# Patient Record
Sex: Male | Born: 1985 | Race: Black or African American | Hispanic: No | Marital: Single | Smoking: Never smoker
Health system: Southern US, Community
[De-identification: ages and names within clinical notes are randomized; demographics above are authoritative.]

## PROBLEM LIST (undated history)

## (undated) DIAGNOSIS — J45909 Unspecified asthma, uncomplicated: Secondary | ICD-10-CM

## (undated) DIAGNOSIS — T7840XA Allergy, unspecified, initial encounter: Secondary | ICD-10-CM

## (undated) DIAGNOSIS — K219 Gastro-esophageal reflux disease without esophagitis: Secondary | ICD-10-CM

## (undated) HISTORY — DX: Unspecified asthma, uncomplicated: J45.909

## (undated) HISTORY — DX: Allergy, unspecified, initial encounter: T78.40XA

---
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
## 2004-12-13 ENCOUNTER — Ambulatory Visit: Payer: Self-pay | Admitting: Family Medicine

## 2005-01-03 ENCOUNTER — Ambulatory Visit: Payer: Self-pay | Admitting: *Deleted

## 2005-01-25 ENCOUNTER — Ambulatory Visit: Payer: Self-pay | Admitting: Family Medicine

## 2005-06-01 ENCOUNTER — Ambulatory Visit: Payer: Self-pay | Admitting: Internal Medicine

## 2006-07-17 ENCOUNTER — Ambulatory Visit: Payer: Self-pay | Admitting: Family Medicine

## 2006-10-31 ENCOUNTER — Encounter (INDEPENDENT_AMBULATORY_CARE_PROVIDER_SITE_OTHER): Payer: Self-pay | Admitting: *Deleted

## 2007-02-22 ENCOUNTER — Ambulatory Visit: Payer: Self-pay | Admitting: Family Medicine

## 2007-02-22 LAB — CONVERTED CEMR LAB
BUN: 16 mg/dL (ref 6–23)
Chlamydia, Swab/Urine, PCR: NEGATIVE
Chloride: 100 meq/L (ref 96–112)
Glucose, Bld: 146 mg/dL — ABNORMAL HIGH (ref 70–99)
Potassium: 4.3 meq/L (ref 3.5–5.3)

## 2007-06-14 ENCOUNTER — Ambulatory Visit: Payer: Self-pay | Admitting: Family Medicine

## 2019-04-23 ENCOUNTER — Ambulatory Visit: Payer: Self-pay | Attending: Internal Medicine

## 2019-04-23 DIAGNOSIS — Z20822 Contact with and (suspected) exposure to covid-19: Secondary | ICD-10-CM

## 2019-04-24 LAB — NOVEL CORONAVIRUS, NAA: SARS-CoV-2, NAA: NOT DETECTED

## 2020-02-24 ENCOUNTER — Other Ambulatory Visit: Payer: Self-pay

## 2020-02-24 DIAGNOSIS — Z20822 Contact with and (suspected) exposure to covid-19: Secondary | ICD-10-CM

## 2020-02-29 LAB — NOVEL CORONAVIRUS, NAA: SARS-CoV-2, NAA: NOT DETECTED

## 2021-03-13 ENCOUNTER — Other Ambulatory Visit: Payer: Self-pay

## 2021-03-13 ENCOUNTER — Encounter (HOSPITAL_COMMUNITY): Payer: Self-pay | Admitting: *Deleted

## 2021-03-13 ENCOUNTER — Observation Stay (HOSPITAL_COMMUNITY): Payer: No Typology Code available for payment source | Admitting: Certified Registered"

## 2021-03-13 ENCOUNTER — Observation Stay (HOSPITAL_COMMUNITY): Payer: No Typology Code available for payment source

## 2021-03-13 ENCOUNTER — Inpatient Hospital Stay (HOSPITAL_COMMUNITY): Payer: No Typology Code available for payment source

## 2021-03-13 ENCOUNTER — Encounter (HOSPITAL_COMMUNITY)
Admission: EM | Disposition: A | Discharge status: Home / Self Care | Payer: Self-pay | Source: Home / Self Care | Attending: Orthopedic Surgery

## 2021-03-13 ENCOUNTER — Inpatient Hospital Stay (HOSPITAL_COMMUNITY)
Admission: EM | Admit: 2021-03-13 | Discharge: 2021-03-14 | DRG: 493 | Disposition: A | Discharge status: Home / Self Care | Payer: No Typology Code available for payment source | Attending: Orthopedic Surgery | Admitting: Orthopedic Surgery

## 2021-03-13 ENCOUNTER — Emergency Department (HOSPITAL_COMMUNITY): Payer: No Typology Code available for payment source

## 2021-03-13 DIAGNOSIS — Z23 Encounter for immunization: Secondary | ICD-10-CM

## 2021-03-13 DIAGNOSIS — D62 Acute posthemorrhagic anemia: Secondary | ICD-10-CM | POA: Diagnosis present

## 2021-03-13 DIAGNOSIS — Z888 Allergy status to other drugs, medicaments and biological substances status: Secondary | ICD-10-CM | POA: Diagnosis not present

## 2021-03-13 DIAGNOSIS — E559 Vitamin D deficiency, unspecified: Secondary | ICD-10-CM | POA: Diagnosis present

## 2021-03-13 DIAGNOSIS — Z20822 Contact with and (suspected) exposure to covid-19: Secondary | ICD-10-CM | POA: Diagnosis present

## 2021-03-13 DIAGNOSIS — S82235B Nondisplaced oblique fracture of shaft of left tibia, initial encounter for open fracture type I or II: Secondary | ICD-10-CM

## 2021-03-13 DIAGNOSIS — S85302A Unspecified injury of greater saphenous vein at lower leg level, left leg, initial encounter: Secondary | ICD-10-CM | POA: Diagnosis present

## 2021-03-13 DIAGNOSIS — T148XXA Other injury of unspecified body region, initial encounter: Secondary | ICD-10-CM

## 2021-03-13 DIAGNOSIS — K219 Gastro-esophageal reflux disease without esophagitis: Secondary | ICD-10-CM | POA: Diagnosis present

## 2021-03-13 DIAGNOSIS — S82202B Unspecified fracture of shaft of left tibia, initial encounter for open fracture type I or II: Secondary | ICD-10-CM | POA: Diagnosis present

## 2021-03-13 DIAGNOSIS — W3400XA Accidental discharge from unspecified firearms or gun, initial encounter: Secondary | ICD-10-CM | POA: Diagnosis not present

## 2021-03-13 DIAGNOSIS — T1490XA Injury, unspecified, initial encounter: Secondary | ICD-10-CM

## 2021-03-13 DIAGNOSIS — S82402B Unspecified fracture of shaft of left fibula, initial encounter for open fracture type I or II: Secondary | ICD-10-CM | POA: Diagnosis present

## 2021-03-13 DIAGNOSIS — S71131A Puncture wound without foreign body, right thigh, initial encounter: Secondary | ICD-10-CM | POA: Diagnosis present

## 2021-03-13 DIAGNOSIS — S82235A Nondisplaced oblique fracture of shaft of left tibia, initial encounter for closed fracture: Secondary | ICD-10-CM | POA: Diagnosis present

## 2021-03-13 DIAGNOSIS — Y249XXA Unspecified firearm discharge, undetermined intent, initial encounter: Secondary | ICD-10-CM

## 2021-03-13 HISTORY — PX: TIBIA IM NAIL INSERTION: SHX2516

## 2021-03-13 HISTORY — PX: I & D EXTREMITY: SHX5045

## 2021-03-13 HISTORY — DX: Gastro-esophageal reflux disease without esophagitis: K21.9

## 2021-03-13 HISTORY — PX: WOUND DEBRIDEMENT: SHX247

## 2021-03-13 LAB — CBC
HCT: 44.7 % (ref 39.0–52.0)
Hemoglobin: 14.6 g/dL (ref 13.0–17.0)
MCH: 29.6 pg (ref 26.0–34.0)
MCHC: 32.7 g/dL (ref 30.0–36.0)
MCV: 90.5 fL (ref 80.0–100.0)
Platelets: 160 10*3/uL (ref 150–400)
RBC: 4.94 MIL/uL (ref 4.22–5.81)
RDW: 13.3 % (ref 11.5–15.5)
WBC: 11.9 10*3/uL — ABNORMAL HIGH (ref 4.0–10.5)
nRBC: 0 % (ref 0.0–0.2)

## 2021-03-13 LAB — COMPREHENSIVE METABOLIC PANEL
ALT: 24 U/L (ref 0–44)
AST: 25 U/L (ref 15–41)
Albumin: 4.3 g/dL (ref 3.5–5.0)
Alkaline Phosphatase: 47 U/L (ref 38–126)
Anion gap: 13 (ref 5–15)
BUN: 11 mg/dL (ref 6–20)
CO2: 21 mmol/L — ABNORMAL LOW (ref 22–32)
Calcium: 8.8 mg/dL — ABNORMAL LOW (ref 8.9–10.3)
Chloride: 104 mmol/L (ref 98–111)
Creatinine, Ser: 1.3 mg/dL — ABNORMAL HIGH (ref 0.61–1.24)
GFR, Estimated: >60 mL/min (ref >60)
Glucose, Bld: 140 mg/dL — ABNORMAL HIGH (ref 70–99)
Potassium: 3.4 mmol/L — ABNORMAL LOW (ref 3.5–5.1)
Sodium: 138 mmol/L (ref 135–145)
Total Bilirubin: 0.5 mg/dL (ref 0.3–1.2)
Total Protein: 7.1 g/dL (ref 6.5–8.1)

## 2021-03-13 LAB — I-STAT CHEM 8, ED
BUN: 13 mg/dL (ref 6–20)
Calcium, Ion: 1.1 mmol/L — ABNORMAL LOW (ref 1.15–1.40)
Chloride: 104 mmol/L (ref 98–111)
Creatinine, Ser: 1.3 mg/dL — ABNORMAL HIGH (ref 0.61–1.24)
Glucose, Bld: 136 mg/dL — ABNORMAL HIGH (ref 70–99)
HCT: 46 % (ref 39.0–52.0)
Hemoglobin: 15.6 g/dL (ref 13.0–17.0)
Potassium: 3.4 mmol/L — ABNORMAL LOW (ref 3.5–5.1)
Sodium: 142 mmol/L (ref 135–145)
TCO2: 24 mmol/L (ref 22–32)

## 2021-03-13 LAB — URINALYSIS, ROUTINE W REFLEX MICROSCOPIC
Bacteria, UA: NONE SEEN
Bilirubin Urine: NEGATIVE
Glucose, UA: NEGATIVE mg/dL
Ketones, ur: NEGATIVE mg/dL
Leukocytes,Ua: NEGATIVE
Nitrite: NEGATIVE
Protein, ur: NEGATIVE mg/dL
Specific Gravity, Urine: 1.044 — ABNORMAL HIGH (ref 1.005–1.030)
pH: 5 (ref 5.0–8.0)

## 2021-03-13 LAB — PROTIME-INR
INR: 0.9 (ref 0.8–1.2)
Prothrombin Time: 12.1 seconds (ref 11.4–15.2)

## 2021-03-13 LAB — ETHANOL: Alcohol, Ethyl (B): 102 mg/dL — ABNORMAL HIGH (ref <10)

## 2021-03-13 LAB — SAMPLE TO BLOOD BANK

## 2021-03-13 LAB — RESP PANEL BY RT-PCR (FLU A&B, COVID) ARPGX2
Influenza A by PCR: NEGATIVE
Influenza B by PCR: NEGATIVE
SARS Coronavirus 2 by RT PCR: NEGATIVE

## 2021-03-13 LAB — LACTIC ACID, PLASMA: Lactic Acid, Venous: 4.1 mmol/L (ref 0.5–1.9)

## 2021-03-13 SURGERY — INSERTION, INTRAMEDULLARY ROD, TIBIA
Anesthesia: General | Site: Thigh | Laterality: Right

## 2021-03-13 MED ORDER — METOCLOPRAMIDE HCL 5 MG/ML IJ SOLN: 5.0000 mg | Freq: Three times a day (TID) | INTRAMUSCULAR | Status: DC | PRN

## 2021-03-13 MED ORDER — CEFAZOLIN SODIUM-DEXTROSE 2-4 GM/100ML-% IV SOLN
2.0000 g | Freq: Three times a day (TID) | INTRAVENOUS | Status: AC
  Administered 2021-03-13 – 2021-03-14 (×3): 2 g via INTRAVENOUS
  Filled 2021-03-13 (×3): qty 100

## 2021-03-13 MED ORDER — CEFAZOLIN SODIUM-DEXTROSE 2-4 GM/100ML-% IV SOLN
2.0000 g | Freq: Once | INTRAVENOUS | Status: AC
  Administered 2021-03-13: 2 g via INTRAVENOUS
  Filled 2021-03-13: qty 100

## 2021-03-13 MED ORDER — DEXAMETHASONE SODIUM PHOSPHATE 10 MG/ML IJ SOLN
INTRAMUSCULAR | Status: DC | PRN
Start: 2021-03-13 — End: 2021-03-13
  Administered 2021-03-13: 10 mg via INTRAVENOUS

## 2021-03-13 MED ORDER — HYDROMORPHONE HCL 1 MG/ML IJ SOLN
0.5000 mg | INTRAMUSCULAR | Status: DC | PRN
  Administered 2021-03-13: 0.5 mg via INTRAVENOUS
  Filled 2021-03-13: qty 1

## 2021-03-13 MED ORDER — CEFAZOLIN SODIUM-DEXTROSE 2-3 GM-%(50ML) IV SOLR
INTRAVENOUS | Status: DC | PRN
Start: 2021-03-13 — End: 2021-03-13
  Administered 2021-03-13: 1.9 g via INTRAVENOUS
  Administered 2021-03-13: .1 g via INTRAVENOUS

## 2021-03-13 MED ORDER — DOCUSATE SODIUM 100 MG PO CAPS
100.0000 mg | ORAL_CAPSULE | Freq: Two times a day (BID) | ORAL | Status: DC
  Administered 2021-03-13 – 2021-03-14 (×2): 100 mg via ORAL
  Filled 2021-03-13 (×2): qty 1

## 2021-03-13 MED ORDER — HYDROMORPHONE HCL 1 MG/ML IJ SOLN: 0.5000 mg | INTRAMUSCULAR | Status: DC | PRN

## 2021-03-13 MED ORDER — CHLORHEXIDINE GLUCONATE 0.12 % MT SOLN: 15.0000 mL | Freq: Once | OROMUCOSAL | Status: AC

## 2021-03-13 MED ORDER — LACTATED RINGERS IV SOLN: INTRAVENOUS | Status: DC

## 2021-03-13 MED ORDER — PROPOFOL 10 MG/ML IV BOLUS
INTRAVENOUS | Status: AC
  Filled 2021-03-13: qty 20

## 2021-03-13 MED ORDER — POTASSIUM CHLORIDE IN NACL 20-0.9 MEQ/L-% IV SOLN
INTRAVENOUS | Status: DC
  Filled 2021-03-13 (×2): qty 1000

## 2021-03-13 MED ORDER — TETANUS-DIPHTH-ACELL PERTUSSIS 5-2.5-18.5 LF-MCG/0.5 IM SUSY
0.5000 mL | PREFILLED_SYRINGE | Freq: Once | INTRAMUSCULAR | Status: AC
  Administered 2021-03-13: 0.5 mL via INTRAMUSCULAR
  Filled 2021-03-13: qty 0.5

## 2021-03-13 MED ORDER — BISACODYL 5 MG PO TBEC: 5.0000 mg | DELAYED_RELEASE_TABLET | Freq: Every day | ORAL | Status: DC | PRN

## 2021-03-13 MED ORDER — FENTANYL CITRATE (PF) 100 MCG/2ML IJ SOLN: 25.0000 ug | INTRAMUSCULAR | Status: DC | PRN

## 2021-03-13 MED ORDER — FENTANYL CITRATE (PF) 250 MCG/5ML IJ SOLN
INTRAMUSCULAR | Status: AC
  Filled 2021-03-13: qty 5

## 2021-03-13 MED ORDER — METHOCARBAMOL 750 MG PO TABS
750.0000 mg | ORAL_TABLET | Freq: Three times a day (TID) | ORAL | Status: DC
  Administered 2021-03-13 – 2021-03-14 (×4): 750 mg via ORAL
  Filled 2021-03-13 (×4): qty 1

## 2021-03-13 MED ORDER — MIDAZOLAM HCL 2 MG/2ML IJ SOLN
INTRAMUSCULAR | Status: AC
  Filled 2021-03-13: qty 2

## 2021-03-13 MED ORDER — 0.9 % SODIUM CHLORIDE (POUR BTL) OPTIME
TOPICAL | Status: DC | PRN
  Administered 2021-03-13 (×2): 1000 mL

## 2021-03-13 MED ORDER — OXYCODONE HCL 5 MG PO TABS: 5.0000 mg | ORAL_TABLET | ORAL | Status: DC | PRN

## 2021-03-13 MED ORDER — FERROUS SULFATE 325 (65 FE) MG PO TABS
325.0000 mg | ORAL_TABLET | Freq: Three times a day (TID) | ORAL | Status: DC
  Administered 2021-03-13 – 2021-03-14 (×3): 325 mg via ORAL
  Filled 2021-03-13 (×3): qty 1

## 2021-03-13 MED ORDER — KETOROLAC TROMETHAMINE 15 MG/ML IJ SOLN
15.0000 mg | Freq: Four times a day (QID) | INTRAMUSCULAR | Status: DC
  Administered 2021-03-13 – 2021-03-14 (×5): 15 mg via INTRAVENOUS
  Filled 2021-03-13 (×5): qty 1

## 2021-03-13 MED ORDER — CHLORHEXIDINE GLUCONATE 0.12 % MT SOLN
OROMUCOSAL | Status: AC
  Administered 2021-03-13: 15 mL via OROMUCOSAL
  Filled 2021-03-13: qty 15

## 2021-03-13 MED ORDER — METHOCARBAMOL 1000 MG/10ML IJ SOLN
500.0000 mg | Freq: Three times a day (TID) | INTRAVENOUS | Status: DC
  Filled 2021-03-13: qty 5

## 2021-03-13 MED ORDER — DIPHENHYDRAMINE HCL 50 MG/ML IJ SOLN
INTRAMUSCULAR | Status: DC | PRN
  Administered 2021-03-13: 25 mg via INTRAVENOUS

## 2021-03-13 MED ORDER — ENOXAPARIN SODIUM 40 MG/0.4ML IJ SOSY
40.0000 mg | PREFILLED_SYRINGE | INTRAMUSCULAR | Status: DC
  Administered 2021-03-14: 40 mg via SUBCUTANEOUS
  Filled 2021-03-13: qty 0.4

## 2021-03-13 MED ORDER — ONDANSETRON HCL 4 MG/2ML IJ SOLN
INTRAMUSCULAR | Status: DC | PRN
  Administered 2021-03-13: 4 mg via INTRAVENOUS

## 2021-03-13 MED ORDER — LIDOCAINE 2% (20 MG/ML) 5 ML SYRINGE
INTRAMUSCULAR | Status: DC | PRN
  Administered 2021-03-13: 60 mg via INTRAVENOUS

## 2021-03-13 MED ORDER — FENTANYL CITRATE PF 50 MCG/ML IJ SOSY
50.0000 ug | PREFILLED_SYRINGE | Freq: Once | INTRAMUSCULAR | Status: DC
  Filled 2021-03-13: qty 1

## 2021-03-13 MED ORDER — SODIUM CHLORIDE 0.9 % IV SOLN: INTRAVENOUS | Status: AC

## 2021-03-13 MED ORDER — SUGAMMADEX SODIUM 200 MG/2ML IV SOLN
INTRAVENOUS | Status: DC | PRN
  Administered 2021-03-13: 200 mg via INTRAVENOUS

## 2021-03-13 MED ORDER — OXYCODONE HCL 5 MG PO TABS
10.0000 mg | ORAL_TABLET | ORAL | Status: DC | PRN
  Administered 2021-03-13 – 2021-03-14 (×3): 15 mg via ORAL
  Filled 2021-03-13 (×5): qty 3

## 2021-03-13 MED ORDER — ONDANSETRON HCL 4 MG PO TABS: 4.0000 mg | ORAL_TABLET | Freq: Four times a day (QID) | ORAL | Status: DC | PRN

## 2021-03-13 MED ORDER — ROCURONIUM BROMIDE 10 MG/ML (PF) SYRINGE
PREFILLED_SYRINGE | INTRAVENOUS | Status: DC | PRN
  Administered 2021-03-13 (×2): 50 mg via INTRAVENOUS

## 2021-03-13 MED ORDER — SODIUM CHLORIDE 0.9 % IV BOLUS
1000.0000 mL | Freq: Once | INTRAVENOUS | Status: AC
  Administered 2021-03-13: 1000 mL via INTRAVENOUS

## 2021-03-13 MED ORDER — ACETAMINOPHEN 500 MG PO TABS
1000.0000 mg | ORAL_TABLET | Freq: Three times a day (TID) | ORAL | Status: AC
  Administered 2021-03-13 – 2021-03-14 (×4): 1000 mg via ORAL
  Filled 2021-03-13 (×4): qty 2

## 2021-03-13 MED ORDER — METOCLOPRAMIDE HCL 10 MG PO TABS: 5.0000 mg | ORAL_TABLET | Freq: Three times a day (TID) | ORAL | Status: DC | PRN

## 2021-03-13 MED ORDER — ONDANSETRON HCL 4 MG/2ML IJ SOLN: 4.0000 mg | Freq: Four times a day (QID) | INTRAMUSCULAR | Status: DC | PRN

## 2021-03-13 MED ORDER — FENTANYL CITRATE (PF) 250 MCG/5ML IJ SOLN
INTRAMUSCULAR | Status: DC | PRN
  Administered 2021-03-13 (×3): 50 ug via INTRAVENOUS
  Administered 2021-03-13: 100 ug via INTRAVENOUS

## 2021-03-13 MED ORDER — PHENYLEPHRINE HCL-NACL 20-0.9 MG/250ML-% IV SOLN
INTRAVENOUS | Status: DC | PRN
  Administered 2021-03-13: 30 ug/min via INTRAVENOUS

## 2021-03-13 MED ORDER — MIDAZOLAM HCL 2 MG/2ML IJ SOLN
INTRAMUSCULAR | Status: DC | PRN
  Administered 2021-03-13: 2 mg via INTRAVENOUS

## 2021-03-13 MED ORDER — OXYCODONE HCL 5 MG/5ML PO SOLN: 5.0000 mg | Freq: Once | ORAL | Status: DC | PRN

## 2021-03-13 MED ORDER — SODIUM CHLORIDE 0.9 % IV BOLUS: 1000.0000 mL | Freq: Once | INTRAVENOUS | Status: DC

## 2021-03-13 MED ORDER — ACETAMINOPHEN 325 MG PO TABS: 325.0000 mg | ORAL_TABLET | Freq: Four times a day (QID) | ORAL | Status: DC | PRN

## 2021-03-13 MED ORDER — OXYCODONE HCL 5 MG PO TABS: 5.0000 mg | ORAL_TABLET | Freq: Once | ORAL | Status: DC | PRN

## 2021-03-13 MED ORDER — IOHEXOL 350 MG/ML SOLN
100.0000 mL | Freq: Once | INTRAVENOUS | Status: AC | PRN
Start: 2021-03-13 — End: 2021-03-13
  Administered 2021-03-13: 100 mL via INTRAVENOUS

## 2021-03-13 MED ORDER — PROPOFOL 10 MG/ML IV BOLUS
INTRAVENOUS | Status: DC | PRN
  Administered 2021-03-13: 140 mg via INTRAVENOUS

## 2021-03-13 MED ORDER — ONDANSETRON HCL 4 MG/2ML IJ SOLN
4.0000 mg | Freq: Three times a day (TID) | INTRAMUSCULAR | Status: DC | PRN
  Administered 2021-03-13: 4 mg via INTRAVENOUS
  Filled 2021-03-13: qty 2

## 2021-03-13 MED ORDER — ORAL CARE MOUTH RINSE: 15.0000 mL | Freq: Once | OROMUCOSAL | Status: AC

## 2021-03-13 MED ORDER — SENNOSIDES-DOCUSATE SODIUM 8.6-50 MG PO TABS: 1.0000 | ORAL_TABLET | Freq: Every evening | ORAL | Status: DC | PRN

## 2021-03-13 SURGICAL SUPPLY — 82 items
BAG COUNTER SPONGE SURGICOUNT (BAG) ×3 IMPLANT
BAG SPNG CNTER NS LX DISP (BAG) ×2
BIT DRILL 3.8X6 NS (BIT) ×1 IMPLANT
BIT DRILL 4.4 NS (BIT) ×1 IMPLANT
BLADE SURG 10 STRL SS (BLADE) ×3 IMPLANT
BNDG CMPR MED 10X6 ELC LF (GAUZE/BANDAGES/DRESSINGS) ×4
BNDG COHESIVE 4X5 TAN STRL (GAUZE/BANDAGES/DRESSINGS) ×3 IMPLANT
BNDG ELASTIC 4X5.8 VLCR STR LF (GAUZE/BANDAGES/DRESSINGS) ×3 IMPLANT
BNDG ELASTIC 6X10 VLCR STRL LF (GAUZE/BANDAGES/DRESSINGS) ×2 IMPLANT
BNDG ELASTIC 6X5.8 VLCR STR LF (GAUZE/BANDAGES/DRESSINGS) ×3 IMPLANT
BNDG GAUZE ELAST 4 BULKY (GAUZE/BANDAGES/DRESSINGS) ×5 IMPLANT
BOOT STEPPER DURA MED (SOFTGOODS) ×1 IMPLANT
BRUSH SCRUB EZ PLAIN DRY (MISCELLANEOUS) ×6 IMPLANT
CNTNR URN SCR LID CUP LEK RST (MISCELLANEOUS) IMPLANT
CONT SPEC 4OZ STRL OR WHT (MISCELLANEOUS) ×3
COVER SURGICAL LIGHT HANDLE (MISCELLANEOUS) ×6 IMPLANT
DRAPE C-ARM 42X72 X-RAY (DRAPES) ×3 IMPLANT
DRAPE C-ARMOR (DRAPES) ×3 IMPLANT
DRAPE HALF SHEET 40X57 (DRAPES) IMPLANT
DRAPE INCISE IOBAN 66X45 STRL (DRAPES) IMPLANT
DRAPE ORTHO SPLIT 77X108 STRL (DRAPES) ×6
DRAPE SURG ORHT 6 SPLT 77X108 (DRAPES) IMPLANT
DRAPE U-SHAPE 47X51 STRL (DRAPES) ×3 IMPLANT
DRESSING MEPILEX FLEX 4X4 (GAUZE/BANDAGES/DRESSINGS) IMPLANT
DRSG ADAPTIC 3X8 NADH LF (GAUZE/BANDAGES/DRESSINGS) ×3 IMPLANT
DRSG MEPILEX FLEX 4X4 (GAUZE/BANDAGES/DRESSINGS) ×6
DRSG MEPITEL 4X7.2 (GAUZE/BANDAGES/DRESSINGS) ×3 IMPLANT
DRSG PAD ABDOMINAL 8X10 ST (GAUZE/BANDAGES/DRESSINGS) ×6 IMPLANT
ELECT REM PT RETURN 9FT ADLT (ELECTROSURGICAL) ×3
ELECTRODE REM PT RTRN 9FT ADLT (ELECTROSURGICAL) ×2 IMPLANT
GAUZE SPONGE 4X4 12PLY STRL (GAUZE/BANDAGES/DRESSINGS) ×3 IMPLANT
GAUZE SPONGE 4X4 12PLY STRL LF (GAUZE/BANDAGES/DRESSINGS) ×1 IMPLANT
GLOVE SRG 8 PF TXTR STRL LF DI (GLOVE) ×2 IMPLANT
GLOVE SURG ENC MOIS LTX SZ8 (GLOVE) ×3 IMPLANT
GLOVE SURG ENC MOIS LTX SZ8.5 (GLOVE) ×3 IMPLANT
GLOVE SURG ORTHO LTX SZ7.5 (GLOVE) ×6 IMPLANT
GLOVE SURG UNDER POLY LF SZ7.5 (GLOVE) ×3 IMPLANT
GLOVE SURG UNDER POLY LF SZ8 (GLOVE) ×3
GLOVE SURG UNDER POLY LF SZ9 (GLOVE) ×3 IMPLANT
GOWN STRL REUS W/ TWL LRG LVL3 (GOWN DISPOSABLE) ×4 IMPLANT
GOWN STRL REUS W/ TWL XL LVL3 (GOWN DISPOSABLE) ×2 IMPLANT
GOWN STRL REUS W/TWL LRG LVL3 (GOWN DISPOSABLE) ×6
GOWN STRL REUS W/TWL XL LVL3 (GOWN DISPOSABLE) ×3
GUIDE ROD 3.0 (MISCELLANEOUS) ×3
HANDPIECE INTERPULSE COAX TIP (DISPOSABLE)
IMMOBILIZER KNEE 22 UNIV (SOFTGOODS) ×1 IMPLANT
KIT BASIN OR (CUSTOM PROCEDURE TRAY) ×3 IMPLANT
KIT TURNOVER KIT B (KITS) ×3 IMPLANT
MANIFOLD NEPTUNE II (INSTRUMENTS) ×3 IMPLANT
NAIL TIBIAL 9MMX33CM (Nail) ×1 IMPLANT
NS IRRIG 1000ML POUR BTL (IV SOLUTION) ×4 IMPLANT
PACK ORTHO EXTREMITY (CUSTOM PROCEDURE TRAY) ×3 IMPLANT
PAD ARMBOARD 7.5X6 YLW CONV (MISCELLANEOUS) ×6 IMPLANT
PAD CAST 4YDX4 CTTN HI CHSV (CAST SUPPLIES) ×2 IMPLANT
PADDING CAST COTTON 4X4 STRL (CAST SUPPLIES) ×3
PADDING CAST COTTON 6X4 STRL (CAST SUPPLIES) ×3 IMPLANT
ROD GUIDE 3.0 (MISCELLANEOUS) IMPLANT
SCREW ACECAP 34MM (Screw) ×1 IMPLANT
SCREW ACECAP 38MM (Screw) ×1 IMPLANT
SCREW PROXIMAL DEPUY (Screw) ×9 IMPLANT
SCREW PRXML FT 55X5.5XNS TIB (Screw) IMPLANT
SCREW PRXML FT 60X5.5XNS LF (Screw) IMPLANT
SCREW PRXML FT 65X5.5XNS CORT (Screw) IMPLANT
SET HNDPC FAN SPRY TIP SCT (DISPOSABLE) IMPLANT
SOL PREP POV-IOD 4OZ 10% (MISCELLANEOUS) ×4 IMPLANT
SOL SCRUB PVP POV-IOD 4OZ 7.5% (MISCELLANEOUS) ×6
SOLUTION SCRB POV-IOD 4OZ 7.5% (MISCELLANEOUS) ×2 IMPLANT
SPONGE T-LAP 18X18 ~~LOC~~+RFID (SPONGE) ×4 IMPLANT
STAPLER VISISTAT 35W (STAPLE) ×3 IMPLANT
STOCKINETTE IMPERVIOUS 9X36 MD (GAUZE/BANDAGES/DRESSINGS) ×1 IMPLANT
SUT ETHILON 2 0 FS 18 (SUTURE) ×8 IMPLANT
SUT PDS AB 2-0 CT1 27 (SUTURE) ×2 IMPLANT
SUT VIC AB 0 CT1 27 (SUTURE) ×3
SUT VIC AB 0 CT1 27XBRD ANBCTR (SUTURE) IMPLANT
SUT VIC AB 2-0 CT1 27 (SUTURE) ×6
SUT VIC AB 2-0 CT1 TAPERPNT 27 (SUTURE) ×2 IMPLANT
TOWEL GREEN STERILE (TOWEL DISPOSABLE) ×6 IMPLANT
TOWEL GREEN STERILE FF (TOWEL DISPOSABLE) ×3 IMPLANT
TUBE CONNECTING 12X1/4 (SUCTIONS) ×3 IMPLANT
UNDERPAD 30X36 HEAVY ABSORB (UNDERPADS AND DIAPERS) ×5 IMPLANT
WATER STERILE IRR 1000ML POUR (IV SOLUTION) ×2 IMPLANT
YANKAUER SUCT BULB TIP NO VENT (SUCTIONS) ×3 IMPLANT
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
## 2021-03-13 NOTE — ED Notes (Signed)
Patient transported to CT 
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
## 2021-03-13 NOTE — Progress Notes (Signed)
Orthopedic Tech Progress Note Patient Details:  Andrew Howard [birth year] [id number]  Patient ID: Andrew Howard, male   DOB: 1985-03-26, 36 y.o.   MRN: [id number] I attended trauma page. Karolee Stamps 03/13/2021, 2:07 AM

## 2021-03-13 NOTE — Transfer of Care (Signed)
Immediate Anesthesia Transfer of Care Note  Patient: Andrew Howard  Procedure(s) Performed: INTRAMEDULLARY (IM) NAIL TIBIAL (Left: Leg Lower) IRRIGATION AND DEBRIDEMENT EXTREMITY RIGHT INNER THIGH AND RIGHT OUTER THIGH (Right: Thigh) DEBRIDEMENT OF SUBCUTANEOUS, MUSCLE & SKIN (Left: Leg Lower)  Patient Location: PACU  Anesthesia Type:General  Level of Consciousness: awake, alert  and oriented  Airway & Oxygen Therapy: Patient Spontanous Breathing and Patient connected to face mask oxygen  Post-op Assessment: Report given to RN and Post -op Vital signs reviewed and stable  Post vital signs: Reviewed and stable  Last Vitals:  Vitals Value Taken Time  BP 149/89 03/13/21 1146  Temp    Pulse 100 03/13/21 1146  Resp 18 03/13/21 1146  SpO2 100 % 03/13/21 1146  Vitals shown include unvalidated device data.  Last Pain:  Vitals:   03/13/21 0732  TempSrc:   PainSc: 0-No pain      Patients Stated Pain Goal: 3 ([id number])  Complications: No notable events documented.
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
## 2021-03-13 NOTE — Progress Notes (Signed)
Orthopedic Tech Progress Note Patient Details:  Andrew Howard 1986/01/18 [id number]  Ortho Devices Type of Ortho Device: Post (short leg) splint, Stirrup splint Ortho Device/Splint Location: lle Ortho Device/Splint Interventions: Ordered, Application, Adjustment   Post Interventions Patient Tolerated: Well Instructions Provided: Care of device, Adjustment of device  Trinna Post 03/13/2021, 2:41 AM

## 2021-03-13 NOTE — Anesthesia Procedure Notes (Signed)
Procedure Name: Intubation Date/Time: 03/13/2021 8:52 AM Performed by: Griffin Dakin, CRNA Pre-anesthesia Checklist: Patient identified, Emergency Drugs available, Suction available and Patient being monitored Patient Re-evaluated:Patient Re-evaluated prior to induction Oxygen Delivery Method: Circle system utilized Preoxygenation: Pre-oxygenation with 100% oxygen Induction Type: IV induction Ventilation: Mask ventilation without difficulty Laryngoscope Size: Mac and 4 Grade View: Grade I Tube type: Oral Tube size: 7.5 mm Number of attempts: 1 Airway Equipment and Method: Stylet and Oral airway Placement Confirmation: ETT inserted through vocal cords under direct vision, positive ETCO2 and breath sounds checked- equal and bilateral Secured at: 23 cm Tube secured with: Tape Dental Injury: Teeth and Oropharynx as per pre-operative assessment
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
## 2021-03-13 NOTE — ED Notes (Signed)
Offered pt pain medication several times, pt declined. Aware that if he changes his mind, there is medication available for pain

## 2021-03-13 NOTE — Brief Op Note (Signed)
03/13/2021  11:58 AM  PATIENT:  Andrew Howard  36 y.o. male  [id number]

## 2021-03-13 NOTE — Anesthesia Preprocedure Evaluation (Signed)
Anesthesia Evaluation  Patient identified by MRN, date of birth, ID band Patient awake    Reviewed: Allergy & Precautions, H&P , NPO status , Patient's Chart, lab work & pertinent test results  Airway Mallampati: II   Neck ROM: full    Dental   Pulmonary neg pulmonary ROS,    breath sounds clear to auscultation       Cardiovascular negative cardio ROS   Rhythm:regular Rate:Normal     Neuro/Psych    GI/Hepatic   Endo/Other    Renal/GU      Musculoskeletal   Abdominal   Peds  Hematology   Anesthesia Other Findings   Reproductive/Obstetrics                             Anesthesia Physical Anesthesia Plan  ASA: 1  Anesthesia Plan: General   Post-op Pain Management:    Induction: Intravenous  PONV Risk Score and Plan: 2 and Ondansetron, Dexamethasone, Midazolam and Treatment may vary due to age or medical condition  Airway Management Planned: Oral ETT  Additional Equipment:   Intra-op Plan:   Post-operative Plan: Extubation in OR  Informed Consent: I have reviewed the patients History and Physical, chart, labs and discussed the procedure including the risks, benefits and alternatives for the proposed anesthesia with the patient or authorized representative who has indicated his/her understanding and acceptance.     Dental advisory given  Plan Discussed with: CRNA, Anesthesiologist and Surgeon  Anesthesia Plan Comments:         Anesthesia Quick Evaluation  

## 2021-03-14 ENCOUNTER — Other Ambulatory Visit (HOSPITAL_COMMUNITY): Payer: Self-pay

## 2021-03-14 ENCOUNTER — Encounter (HOSPITAL_COMMUNITY): Payer: Self-pay | Admitting: Orthopedic Surgery

## 2021-03-14 DIAGNOSIS — S85302A Unspecified injury of greater saphenous vein at lower leg level, left leg, initial encounter: Secondary | ICD-10-CM

## 2021-03-14 DIAGNOSIS — E559 Vitamin D deficiency, unspecified: Secondary | ICD-10-CM | POA: Diagnosis present

## 2021-03-14 DIAGNOSIS — S71131A Puncture wound without foreign body, right thigh, initial encounter: Secondary | ICD-10-CM

## 2021-03-14 HISTORY — DX: Unspecified injury of greater saphenous vein at lower leg level, left leg, initial encounter: S85.302A

## 2021-03-14 HISTORY — DX: Puncture wound without foreign body, right thigh, initial encounter: S71.131A

## 2021-03-14 HISTORY — DX: Vitamin D deficiency, unspecified: E55.9

## 2021-03-14 LAB — CBC
HCT: 37.7 % — ABNORMAL LOW (ref 39.0–52.0)
Hemoglobin: 12.2 g/dL — ABNORMAL LOW (ref 13.0–17.0)
MCH: 28.6 pg (ref 26.0–34.0)
MCHC: 32.4 g/dL (ref 30.0–36.0)
MCV: 88.5 fL (ref 80.0–100.0)
Platelets: 150 10*3/uL (ref 150–400)
RBC: 4.26 MIL/uL (ref 4.22–5.81)
RDW: 13.4 % (ref 11.5–15.5)
WBC: 11 10*3/uL — ABNORMAL HIGH (ref 4.0–10.5)
nRBC: 0 % (ref 0.0–0.2)

## 2021-03-14 LAB — BASIC METABOLIC PANEL
Anion gap: 6 (ref 5–15)
BUN: 10 mg/dL (ref 6–20)
CO2: 28 mmol/L (ref 22–32)
Calcium: 8.3 mg/dL — ABNORMAL LOW (ref 8.9–10.3)
Chloride: 104 mmol/L (ref 98–111)
Creatinine, Ser: 1.1 mg/dL (ref 0.61–1.24)
GFR, Estimated: >60 mL/min (ref >60)
Glucose, Bld: 115 mg/dL — ABNORMAL HIGH (ref 70–99)
Potassium: 3.7 mmol/L (ref 3.5–5.1)
Sodium: 138 mmol/L (ref 135–145)

## 2021-03-14 LAB — VITAMIN D 25 HYDROXY (VIT D DEFICIENCY, FRACTURES): Vit D, 25-Hydroxy: 13.72 ng/mL — ABNORMAL LOW (ref 30–100)

## 2021-03-14 MED ORDER — METHOCARBAMOL 750 MG PO TABS
750.0000 mg | ORAL_TABLET | Freq: Three times a day (TID) | ORAL | 0 refills | Status: DC
  Filled 2021-03-14: qty 60, 20d supply, fill #0

## 2021-03-14 MED ORDER — RIVAROXABAN 10 MG PO TABS
10.0000 mg | ORAL_TABLET | Freq: Every day | ORAL | 0 refills | Status: DC
  Filled 2021-03-14: qty 30, 30d supply, fill #0

## 2021-03-14 MED ORDER — CHOLECALCIFEROL 125 MCG (5000 UT) PO TABS
ORAL_TABLET | Freq: Every day | ORAL | 6 refills | Status: DC
Start: 2021-03-14 — End: 2023-10-01
  Filled 2021-03-14: qty 30, 30d supply, fill #0

## 2021-03-14 MED ORDER — ACETAMINOPHEN 500 MG PO TABS
500.0000 mg | ORAL_TABLET | Freq: Two times a day (BID) | ORAL | 0 refills | Status: DC
  Filled 2021-03-14: qty 60, 30d supply, fill #0

## 2021-03-14 MED ORDER — DOCUSATE SODIUM 100 MG PO CAPS
100.0000 mg | ORAL_CAPSULE | Freq: Two times a day (BID) | ORAL | 0 refills | Status: DC
  Filled 2021-03-14: qty 20, 10d supply, fill #0

## 2021-03-14 MED ORDER — OXYCODONE-ACETAMINOPHEN 7.5-325 MG PO TABS
1.0000 | ORAL_TABLET | Freq: Three times a day (TID) | ORAL | 0 refills | Status: AC | PRN
  Filled 2021-03-14: qty 28, 7d supply, fill #0

## 2021-03-14 MED ORDER — VITAMIN C 1000 MG PO TABS
1000.0000 mg | ORAL_TABLET | Freq: Every day | ORAL | 2 refills | Status: DC
  Filled 2021-03-14: qty 30, 30d supply, fill #0

## 2021-03-14 MED ORDER — FERROUS SULFATE 325 (65 FE) MG PO TABS
325.0000 mg | ORAL_TABLET | Freq: Three times a day (TID) | ORAL | 3 refills | Status: AC
Start: 2021-03-14 — End: ?
  Filled 2021-03-14: qty 90, 30d supply, fill #0

## 2021-03-14 MED ORDER — ZINC SULFATE 220 (50 ZN) MG PO TABS
1.0000 | ORAL_TABLET | Freq: Every day | ORAL | 1 refills | Status: AC
Start: 2021-03-14 — End: ?
  Filled 2021-03-14: qty 30, 30d supply, fill #0
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
## 2021-03-14 NOTE — TOC Progression Note (Addendum)
Transition of Care [facility]) - Progression Note    Patient Details  Name: Andrew Howard MRN: [id number] Date of Birth: [birth year]  [facility] of [facility] CM/SW [place], RN Phone [phone]  03/14/2021, 1:45 PM  Clinical Narrative:    Per Alexia with PT patient has recommendation for crutched. Ortho tech has been made aware and will deliver to the room. Cm has requested order per Ainsley Spinner PA-C.     1400 Orders for crutches have been entered. Ortho tech has delivered crutched to the room. Patient has recommendations for 3in1. Cm explained what the 3in1 is and patient does not wish to have this DME ordered, He states that he thinks he will be ok. No other DME needs noted at this time.   1440 patient is now agreeable to a 3in1. CM has called referral to [employer]. DME will be delivered to the bedside  Expected Discharge Plan and Services           Expected Discharge Date: 03/14/21                                     Social Determinants of Health (SDOH) Interventions    Readmission Risk Interventions No flowsheet data found.
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
## 2021-03-14 NOTE — Progress Notes (Signed)
Orthopedic Tech Progress Note Patient Details:  Andrew Howard 1985/07/01 [id number]  Social worker called requesting a pair of CRUTCHES for patient. Dropped them off to room, SOCIAL WORKER working on an order for crutches  Ortho Devices Type of Ortho Device: Crutches Ortho Device/Splint Location: lle Ortho Device/Splint Interventions: Ordered, Application, Adjustment   Post Interventions Patient Tolerated: Ambulated well, Well Instructions Provided: Poper ambulation with device, Care of device  Donald Pore 03/14/2021, 1:56 PM
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
## 2021-03-14 NOTE — TOC CAGE-AID Note (Signed)
Transition of Care [facility]) - CAGE-AID Screening   Patient Details  Name: Andrew Howard MRN: [id number] Date of Birth: 1986-02-07  [facility]) CM/SW Contact:    Lossie Faes Tarpley-Carter, LCSWA Phone Number: 03/14/2021, 2:36 PM   Clinical Narrative: Pt participated in Cage-Aid.  Pt stated he does not use substance or ETOH.  Pt stated he drinks socially on occasion.  Pt was not offered resources, due to no usage of substance or ETOH.     Tysin Salada Tarpley-Carter, MSW, LCSW-A Pronouns:  She/Her/Hers Smoke Rise Transitions of Care Clinical Social Worker Direct Number:  [phone] [email or web address]  CAGE-AID Screening:    Have You Ever Felt You Ought to Cut Down on Your Drinking or Drug Use?: No Have People Annoyed You By [employer] Your Drinking Or Drug Use?: No Have You Felt Bad Or Guilty About Your Drinking Or Drug Use?: No Have You Ever Had a Drink or Used Drugs First Thing In The Morning to Steady Your Nerves or to Get Rid of a Hangover?: No CAGE-AID Score: 0  Substance Abuse Education Offered: No

## 2021-03-15 ENCOUNTER — Encounter (HOSPITAL_COMMUNITY): Payer: Self-pay | Admitting: Orthopedic Surgery
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
## 2021-03-25 ENCOUNTER — Other Ambulatory Visit (HOSPITAL_COMMUNITY): Payer: Self-pay

## 2021-03-29 ENCOUNTER — Other Ambulatory Visit (HOSPITAL_BASED_OUTPATIENT_CLINIC_OR_DEPARTMENT_OTHER): Payer: Self-pay

## 2021-03-30 ENCOUNTER — Telehealth (HOSPITAL_COMMUNITY): Payer: Self-pay

## 2021-03-30 ENCOUNTER — Other Ambulatory Visit (HOSPITAL_COMMUNITY): Payer: Self-pay

## 2021-03-30 NOTE — Telephone Encounter (Signed)
Pharmacy Transitions of Care Follow-up Telephone Call  Date of discharge: 03/14/2021  Discharge Diagnosis: DVT/PE Prophylaxis  How have you been since you were released from the hospital? Patient reports doing well, no questions or concerns at this time.    Medication changes made at discharge:  - START: Xarelto 10 mg daily  Medication changes verified by the patient? Yes (Yes/No)  Medication Accessibility:  [facility] [place]    Was the patient provided with refills on discharged medications? No   Have all prescriptions been transferred from [facility] to home pharmacy? No   Is the patient able to afford medications? Patient has insurance   Medication Review: (RIVAROXABAN (XARELTO)  Rivaroxaban 10 mg daily initiated on 03/14/2021.  - Discussed importance of taking medication with food and around the same time everyday  - Advised patient of medications to avoid (NSAIDs, ASA)  - Educated that Tylenol (acetaminophen) will be the preferred analgesic to prevent risk of bleeding  - Emphasized importance of monitoring for signs and symptoms of bleeding (abnormal bruising, prolonged bleeding, nose bleeds, bleeding from gums, discolored urine, black tarry stools)  - Advised patient to alert all providers of anticoagulation therapy prior to starting a new medication or having a procedure   Follow-up Appointments:  [facility] Hospital f/u appt confirmed? Orthopedics Scheduled to see Montez Morita in 2 weeks  If their condition worsens, is the pt aware to call PCP or go to the Emergency Dept.? Yes  Final Patient Assessment: Patient reports doing well, reports saw provider that performed surgery on 03/28/2021 and got stiches removed. Patient reports will return to see him in two weeks as well. Patient asked to go over side effects and monitoring parameters. No further questions at this time.

## 2021-09-11 ENCOUNTER — Other Ambulatory Visit: Payer: Self-pay

## 2021-09-11 ENCOUNTER — Ambulatory Visit (HOSPITAL_COMMUNITY)
Admission: RE | Admit: 2021-09-11 | Discharge: 2021-09-11 | Disposition: A | Discharge status: Home / Self Care | Payer: Self-pay | Source: Ambulatory Visit | Attending: Internal Medicine | Admitting: Internal Medicine

## 2021-09-11 ENCOUNTER — Encounter (HOSPITAL_COMMUNITY): Payer: Self-pay

## 2021-09-11 VITALS — BP 145/76 | HR 85 | Temp 99.9°F | Resp 14

## 2021-09-11 DIAGNOSIS — R21 Rash and other nonspecific skin eruption: Secondary | ICD-10-CM

## 2021-09-11 DIAGNOSIS — T7840XA Allergy, unspecified, initial encounter: Secondary | ICD-10-CM

## 2021-09-11 DIAGNOSIS — L509 Urticaria, unspecified: Secondary | ICD-10-CM

## 2021-09-11 MED ORDER — FAMOTIDINE 20 MG PO TABS: 20.0000 mg | ORAL_TABLET | Freq: Two times a day (BID) | ORAL | 0 refills | Status: DC

## 2021-09-11 MED ORDER — CETIRIZINE HCL 10 MG PO TABS: 10.0000 mg | ORAL_TABLET | Freq: Every day | ORAL | 0 refills | Status: DC

## 2021-09-11 MED ORDER — PREDNISONE 10 MG (21) PO TBPK: ORAL_TABLET | Freq: Every day | ORAL | 0 refills | Status: DC

## 2021-09-11 NOTE — ED Triage Notes (Signed)
Friday 09/02/2021 was stung by yellow jacket.  Noticed bumps.  Took benadryl.  Noticed bite location itching very badly.  Patient has swelling as well.  Patient reports itching worsened.  Patient reports site of insect sting was lower right leg.
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
## 2021-09-11 NOTE — Discharge Instructions (Signed)
It appears that you are having an allergic reaction.  You have been sent 3 medications to alleviate symptoms.  Please follow-up if symptoms persist or worsen.
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
## 2023-10-01 ENCOUNTER — Other Ambulatory Visit (HOSPITAL_COMMUNITY)
Admission: RE | Admit: 2023-10-01 | Discharge: 2023-10-01 | Disposition: A | Discharge status: Home / Self Care | Source: Ambulatory Visit | Attending: Family Medicine | Admitting: Family Medicine

## 2023-10-01 ENCOUNTER — Ambulatory Visit (INDEPENDENT_AMBULATORY_CARE_PROVIDER_SITE_OTHER): Payer: Self-pay | Admitting: Family Medicine

## 2023-10-01 ENCOUNTER — Encounter (HOSPITAL_BASED_OUTPATIENT_CLINIC_OR_DEPARTMENT_OTHER): Payer: Self-pay | Admitting: Family Medicine

## 2023-10-01 VITALS — BP 114/55 | HR 69 | Temp 98.6°F | Ht 67.0 in | Wt 188.0 lb

## 2023-10-01 DIAGNOSIS — J302 Other seasonal allergic rhinitis: Secondary | ICD-10-CM | POA: Diagnosis not present

## 2023-10-01 DIAGNOSIS — Z202 Contact with and (suspected) exposure to infections with a predominantly sexual mode of transmission: Secondary | ICD-10-CM | POA: Diagnosis not present

## 2023-10-01 MED ORDER — FEXOFENADINE HCL 180 MG PO TABS: 180.0000 mg | ORAL_TABLET | Freq: Every day | ORAL | 3 refills | Status: AC

## 2023-10-01 MED ORDER — FLUTICASONE PROPIONATE 50 MCG/ACT NA SUSP: 2.0000 | Freq: Every day | NASAL | 3 refills | Status: AC
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
## 2023-10-02 ENCOUNTER — Ambulatory Visit (HOSPITAL_BASED_OUTPATIENT_CLINIC_OR_DEPARTMENT_OTHER): Payer: Self-pay | Admitting: Family Medicine

## 2023-10-02 LAB — URINE CYTOLOGY ANCILLARY ONLY
Chlamydia: NEGATIVE
Comment: NEGATIVE
Comment: NORMAL
Neisseria Gonorrhea: NEGATIVE

## 2023-10-02 NOTE — Progress Notes (Signed)
 Urine cytology is negative for STI.

## 2023-11-12 ENCOUNTER — Ambulatory Visit (INDEPENDENT_AMBULATORY_CARE_PROVIDER_SITE_OTHER): Admitting: Family Medicine

## 2023-11-12 ENCOUNTER — Encounter (HOSPITAL_BASED_OUTPATIENT_CLINIC_OR_DEPARTMENT_OTHER): Payer: Self-pay | Admitting: Family Medicine

## 2023-11-12 VITALS — BP 115/82 | HR 69 | Ht 67.0 in | Wt 191.0 lb

## 2023-11-12 DIAGNOSIS — E559 Vitamin D deficiency, unspecified: Secondary | ICD-10-CM | POA: Diagnosis not present

## 2023-11-12 DIAGNOSIS — Z0000 Encounter for general adult medical examination without abnormal findings: Secondary | ICD-10-CM

## 2023-11-12 DIAGNOSIS — Z1159 Encounter for screening for other viral diseases: Secondary | ICD-10-CM

## 2023-11-12 DIAGNOSIS — Z23 Encounter for immunization: Secondary | ICD-10-CM | POA: Diagnosis not present

## 2023-11-12 DIAGNOSIS — Z13220 Encounter for screening for lipoid disorders: Secondary | ICD-10-CM

## 2023-11-12 DIAGNOSIS — Z202 Contact with and (suspected) exposure to infections with a predominantly sexual mode of transmission: Secondary | ICD-10-CM

## 2023-11-12 DIAGNOSIS — Z114 Encounter for screening for human immunodeficiency virus [HIV]: Secondary | ICD-10-CM
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
## 2023-11-12 NOTE — Patient Instructions (Signed)

## 2023-11-13 ENCOUNTER — Ambulatory Visit (HOSPITAL_BASED_OUTPATIENT_CLINIC_OR_DEPARTMENT_OTHER): Payer: Self-pay | Admitting: Family Medicine

## 2023-11-13 LAB — CBC WITH DIFFERENTIAL/PLATELET
Basophils Absolute: 0 x10E3/uL (ref 0.0–0.2)
Basos: 0 %
EOS (ABSOLUTE): 0.2 x10E3/uL (ref 0.0–0.4)
Eos: 3 %
Hematocrit: 47.4 % (ref 37.5–51.0)
Hemoglobin: 15.1 g/dL (ref 13.0–17.7)
Immature Grans (Abs): 0 x10E3/uL (ref 0.0–0.1)
Immature Granulocytes: 0 %
Lymphocytes Absolute: 2.4 x10E3/uL (ref 0.7–3.1)
Lymphs: 40 %
MCH: 29 pg (ref 26.6–33.0)
MCHC: 31.9 g/dL (ref 31.5–35.7)
MCV: 91 fL (ref 79–97)
Monocytes Absolute: 0.4 x10E3/uL (ref 0.1–0.9)
Monocytes: 7 %
Neutrophils Absolute: 2.9 x10E3/uL (ref 1.4–7.0)
Neutrophils: 50 %
Platelets: 161 x10E3/uL (ref 150–450)
RBC: 5.21 x10E6/uL (ref 4.14–5.80)
RDW: 13.1 % (ref 11.6–15.4)
WBC: 5.9 x10E3/uL (ref 3.4–10.8)

## 2023-11-13 LAB — TSH: TSH: 1.03 u[IU]/mL (ref 0.450–4.500)

## 2023-11-13 LAB — LIPID PANEL
Chol/HDL Ratio: 3.6 ratio (ref 0.0–5.0)
Cholesterol, Total: 197 mg/dL (ref 100–199)
HDL: 55 mg/dL (ref >39)
LDL Chol Calc (NIH): 132 mg/dL — ABNORMAL HIGH (ref 0–99)
Triglycerides: 56 mg/dL (ref 0–149)
VLDL Cholesterol Cal: 10 mg/dL (ref 5–40)

## 2023-11-13 LAB — COMPREHENSIVE METABOLIC PANEL WITH GFR
ALT: 18 IU/L (ref 0–44)
AST: 20 IU/L (ref 0–40)
Albumin: 4.7 g/dL (ref 4.1–5.1)
Alkaline Phosphatase: 56 IU/L (ref 47–123)
BUN/Creatinine Ratio: 10 (ref 9–20)
BUN: 12 mg/dL (ref 6–20)
Bilirubin Total: 0.5 mg/dL (ref 0.0–1.2)
CO2: 23 mmol/L (ref 20–29)
Calcium: 9.5 mg/dL (ref 8.7–10.2)
Chloride: 102 mmol/L (ref 96–106)
Creatinine, Ser: 1.26 mg/dL (ref 0.76–1.27)
Globulin, Total: 2.6 g/dL (ref 1.5–4.5)
Glucose: 92 mg/dL (ref 70–99)
Potassium: 4.3 mmol/L (ref 3.5–5.2)
Sodium: 140 mmol/L (ref 134–144)
Total Protein: 7.3 g/dL (ref 6.0–8.5)
eGFR: 75 mL/min/1.73 (ref >59)

## 2023-11-13 LAB — HEPATITIS C ANTIBODY: Hep C Virus Ab: NONREACTIVE

## 2023-11-13 LAB — HIV ANTIBODY (ROUTINE TESTING W REFLEX): HIV Screen 4th Generation wRfx: NONREACTIVE

## 2023-11-13 LAB — RPR: RPR Ser Ql: NONREACTIVE

## 2023-11-13 LAB — VITAMIN D 25 HYDROXY (VIT D DEFICIENCY, FRACTURES): Vit D, 25-Hydroxy: 18.9 ng/mL — ABNORMAL LOW (ref 30.0–100.0)

## 2023-11-13 NOTE — Progress Notes (Signed)
 Hi Andrew Howard,  Your Vitamin D is low still. I would recommend starting a Vitamin D3 supplement of at least 1000 units daily with Calcium 600-800 units daily. Your LDL cholesterol was slightly elevated. Continue a good diet and regular exercise. Your blood counts are stable as well as your electrolytes, kidney and liver function. Your thyroid function is stable. Your STD testing is negative.

## 2023-11-26 ENCOUNTER — Ambulatory Visit (HOSPITAL_BASED_OUTPATIENT_CLINIC_OR_DEPARTMENT_OTHER)

## 2023-11-30 ENCOUNTER — Ambulatory Visit (INDEPENDENT_AMBULATORY_CARE_PROVIDER_SITE_OTHER): Admitting: *Deleted

## 2023-11-30 DIAGNOSIS — Z23 Encounter for immunization: Secondary | ICD-10-CM | POA: Diagnosis not present

## 2023-11-30 NOTE — Progress Notes (Signed)
 Patient is in office today for a nurse visit for HPV Immunization. Patient Injection was given in the  Right deltoid. Patient tolerated injection well.

## 2024-01-04 ENCOUNTER — Ambulatory Visit (HOSPITAL_BASED_OUTPATIENT_CLINIC_OR_DEPARTMENT_OTHER)

## 2024-01-08 ENCOUNTER — Ambulatory Visit (INDEPENDENT_AMBULATORY_CARE_PROVIDER_SITE_OTHER): Admitting: *Deleted

## 2024-01-08 ENCOUNTER — Encounter (HOSPITAL_BASED_OUTPATIENT_CLINIC_OR_DEPARTMENT_OTHER): Payer: Self-pay | Admitting: Family Medicine

## 2024-01-08 DIAGNOSIS — Z23 Encounter for immunization: Secondary | ICD-10-CM

## 2024-01-08 NOTE — Progress Notes (Signed)
 Patient is in office today for a nurse visit for 2nd HPV Immunization. Patient Injection was given in the  Left deltoid. Patient tolerated injection well.

## 2024-01-09 NOTE — Telephone Encounter (Signed)
 Routing to Jenny and Kiana for assistance with trying to reach out to pt to get him on nurse schedule to receive 3rd HPV immunization May 2026.

## 2024-06-20 ENCOUNTER — Ambulatory Visit (HOSPITAL_BASED_OUTPATIENT_CLINIC_OR_DEPARTMENT_OTHER)

## 2024-11-12 ENCOUNTER — Encounter (HOSPITAL_BASED_OUTPATIENT_CLINIC_OR_DEPARTMENT_OTHER): Admitting: Family Medicine
